# Patient Record
Sex: Female | Born: 1995 | Race: Black or African American | Hispanic: No | Marital: Single
Health system: Southern US, Community
[De-identification: ages and names within clinical notes are randomized; demographics above are authoritative.]

## PROBLEM LIST (undated history)

## (undated) DIAGNOSIS — O009 Unspecified ectopic pregnancy without intrauterine pregnancy: Secondary | ICD-10-CM

## (undated) HISTORY — PX: ECTOPIC PREGNANCY SURGERY: SHX613

---
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
## 2018-08-06 DIAGNOSIS — O009 Unspecified ectopic pregnancy without intrauterine pregnancy: Secondary | ICD-10-CM

## 2018-08-06 HISTORY — DX: Unspecified ectopic pregnancy without intrauterine pregnancy: O00.90

## 2018-10-23 ENCOUNTER — Encounter (HOSPITAL_BASED_OUTPATIENT_CLINIC_OR_DEPARTMENT_OTHER): Payer: Self-pay | Admitting: *Deleted

## 2018-10-23 ENCOUNTER — Other Ambulatory Visit: Payer: Self-pay

## 2018-10-23 ENCOUNTER — Emergency Department (HOSPITAL_BASED_OUTPATIENT_CLINIC_OR_DEPARTMENT_OTHER)
Admission: EM | Admit: 2018-10-23 | Discharge: 2018-10-23 | Disposition: A | Discharge status: Home / Self Care | Payer: Medicaid Other | Attending: Emergency Medicine | Admitting: Emergency Medicine

## 2018-10-23 DIAGNOSIS — O211 Hyperemesis gravidarum with metabolic disturbance: Secondary | ICD-10-CM | POA: Insufficient documentation

## 2018-10-23 DIAGNOSIS — Z3A01 Less than 8 weeks gestation of pregnancy: Secondary | ICD-10-CM | POA: Insufficient documentation

## 2018-10-23 DIAGNOSIS — E876 Hypokalemia: Secondary | ICD-10-CM | POA: Diagnosis not present

## 2018-10-23 DIAGNOSIS — R1110 Vomiting, unspecified: Secondary | ICD-10-CM

## 2018-10-23 LAB — CBC WITH DIFFERENTIAL/PLATELET
Abs Immature Granulocytes: 0.02 10*3/uL (ref 0.00–0.07)
Basophils Absolute: 0 10*3/uL (ref 0.0–0.1)
Basophils Relative: 0 %
Eosinophils Absolute: 0 10*3/uL (ref 0.0–0.5)
Eosinophils Relative: 0 %
HCT: 43.8 % (ref 36.0–46.0)
Hemoglobin: 13.8 g/dL (ref 12.0–15.0)
Immature Granulocytes: 0 %
Lymphocytes Relative: 32 %
Lymphs Abs: 2.5 10*3/uL (ref 0.7–4.0)
MCH: 26.4 pg (ref 26.0–34.0)
MCHC: 31.5 g/dL (ref 30.0–36.0)
MCV: 83.9 fL (ref 80.0–100.0)
MONO ABS: 0.5 10*3/uL (ref 0.1–1.0)
Monocytes Relative: 7 %
Neutro Abs: 4.7 10*3/uL (ref 1.7–7.7)
Neutrophils Relative %: 61 %
Platelets: 466 10*3/uL — ABNORMAL HIGH (ref 150–400)
RBC: 5.22 MIL/uL — AB (ref 3.87–5.11)
RDW: 12.7 % (ref 11.5–15.5)
WBC: 7.8 10*3/uL (ref 4.0–10.5)
nRBC: 0 % (ref 0.0–0.2)

## 2018-10-23 LAB — COMPREHENSIVE METABOLIC PANEL
ALT: 21 U/L (ref 0–44)
AST: 22 U/L (ref 15–41)
Albumin: 4.9 g/dL (ref 3.5–5.0)
Alkaline Phosphatase: 73 U/L (ref 38–126)
Anion gap: 11 (ref 5–15)
BUN: 16 mg/dL (ref 6–20)
CO2: 20 mmol/L — AB (ref 22–32)
Calcium: 9.5 mg/dL (ref 8.9–10.3)
Chloride: 102 mmol/L (ref 98–111)
Creatinine, Ser: 0.93 mg/dL (ref 0.44–1.00)
GFR calc Af Amer: >60 mL/min (ref >60)
GFR calc non Af Amer: >60 mL/min (ref >60)
Glucose, Bld: 98 mg/dL (ref 70–99)
Potassium: 2.9 mmol/L — ABNORMAL LOW (ref 3.5–5.1)
Sodium: 133 mmol/L — ABNORMAL LOW (ref 135–145)
Total Bilirubin: 0.9 mg/dL (ref 0.3–1.2)
Total Protein: 9 g/dL — ABNORMAL HIGH (ref 6.5–8.1)

## 2018-10-23 MED ORDER — POTASSIUM CHLORIDE CRYS ER 20 MEQ PO TBCR
40.0000 meq | EXTENDED_RELEASE_TABLET | Freq: Once | ORAL | Status: AC
  Administered 2018-10-23: 40 meq via ORAL
  Filled 2018-10-23: qty 2

## 2018-10-23 MED ORDER — SODIUM CHLORIDE 0.9 % IV BOLUS
1000.0000 mL | Freq: Once | INTRAVENOUS | Status: AC
  Administered 2018-10-23: 1000 mL via INTRAVENOUS

## 2018-10-23 MED ORDER — ONDANSETRON 4 MG PO TBDP: 4.0000 mg | ORAL_TABLET | Freq: Three times a day (TID) | ORAL | 0 refills | Status: DC | PRN

## 2018-10-23 MED ORDER — ONDANSETRON 8 MG PO TBDP
8.0000 mg | ORAL_TABLET | Freq: Once | ORAL | Status: AC
  Administered 2018-10-23: 8 mg via ORAL
  Filled 2018-10-23: qty 1

## 2018-10-23 NOTE — ED Notes (Signed)
NAD at this time. Pt is stable and going home.  

## 2018-10-23 NOTE — ED Triage Notes (Addendum)
Pt c/o vomiting  x 2 weeks , pt is preg, seen by [facility] ED yesterday and on 15 th

## 2018-10-23 NOTE — Discharge Instructions (Addendum)
Home to rest. Push hydrating fluids like G2 (Gatorade with less sugar).  Zofran as needed for vomiting. Limit use of zofran. Zofran can cause constipation. Follow up with your OB as scheduled.

## 2018-10-23 NOTE — ED Provider Notes (Signed)
[facility] EMERGENCY DEPARTMENT Provider Note   CSN: [id number] Arrival date & time: 10/23/18  1652     History   Chief Complaint Chief Complaint  Patient presents with  . Emesis    HPI Mary Grimes is a 22 y.o. female.  22 year old female presents with complaint of nausea and vomiting in early pregnancy.  Patient was seen at [facility] ER on December 15, had an ultrasound showing 6 weeks and 0 days gestational sac.  Patient continued to vomit and called her OBs office who sent and likely just yesterday.  Patient states that she is unable to keep the Diclegis down.  Patient has tried OTC medications without relief.  Patient states unable to keep down small sips of liquids or solids.  Denies abdominal pain, fevers, chills, changes in bowel or bladder habits.  No other complaints or concerns at this time.     History reviewed. No pertinent past medical history.  There are no active problems to display for this patient.   History reviewed. No pertinent surgical history.   OB History    Gravida  1   Para      Term      Preterm      AB      Living        SAB      TAB      Ectopic      Multiple      Live Births               Home Medications    Prior to Admission medications   Medication Sig Start Date End Date Taking? Authorizing Provider  Doxylamine-Pyridoxine (DICLEGIS PO) Take by mouth.   Yes [provider]  ondansetron (ZOFRAN ODT) 4 MG disintegrating tablet Take 1 tablet (4 mg total) by mouth every 8 (eight) hours as needed for nausea or vomiting. 10/23/18   Jeannie Fend, PA-C    Family History No family history on file.  Social History Social History   Tobacco Use  . Smoking status: Never Smoker  Substance Use Topics  . Alcohol use: Not Currently  . Drug use: Yes    Types: Marijuana     Allergies   Patient has no known allergies.   Review of Systems Review of Systems  Constitutional: Negative for  fever.  Gastrointestinal: Positive for nausea and vomiting. Negative for abdominal pain, constipation and diarrhea.  Genitourinary: Negative for dysuria, frequency and urgency.  Skin: Negative for rash.  Allergic/Immunologic: Negative for immunocompromised state.  Neurological: Negative for weakness.  All other systems reviewed and are negative.    Physical Exam Updated Vital Signs BP 115/72   Pulse 80   Temp 98.1 F (36.7 C) (Oral)   Resp 18   Ht 6\' 7"  (2.007 m)   Wt 77.1 kg   SpO2 100%   BMI 19.15 kg/m   Physical Exam Vitals signs and nursing note reviewed.  Constitutional:      General: She is not in acute distress.    Appearance: She is well-developed. She is not diaphoretic.  HENT:     Head: Normocephalic and atraumatic.     Mouth/Throat:     Mouth: Mucous membranes are moist.  Cardiovascular:     Rate and Rhythm: Normal rate and regular rhythm.     Pulses: Normal pulses.  Pulmonary:     Effort: Pulmonary effort is normal.     Breath sounds: Normal breath sounds.  Abdominal:  General: Abdomen is flat. There is no distension.     Tenderness: There is no abdominal tenderness.  Skin:    General: Skin is warm and dry.     Coloration: Skin is not pale.  Neurological:     Mental Status: She is alert and oriented to person, place, and time.  Psychiatric:        Mood and Affect: Mood normal.        Behavior: Behavior normal.      ED Treatments / Results  Labs (all labs ordered are listed, but only abnormal results are displayed) Labs Reviewed  CBC WITH DIFFERENTIAL/PLATELET - Abnormal; Notable for the following components:      Result Value   RBC 5.22 (*)    Platelets 466 (*)    All other components within normal limits  COMPREHENSIVE METABOLIC PANEL - Abnormal; Notable for the following components:   Sodium 133 (*)    Potassium 2.9 (*)    CO2 20 (*)    Total Protein 9.0 (*)    All other components within normal limits     EKG None  Radiology No results found.  Procedures Procedures (including critical care time)  Medications Ordered in ED Medications  sodium chloride 0.9 % bolus 1,000 mL (0 mLs Intravenous Stopped 10/23/18 1833)  ondansetron (ZOFRAN-ODT) disintegrating tablet 8 mg (8 mg Oral Given 10/23/18 1726)  potassium chloride SA (K-DUR,KLOR-CON) CR tablet 40 mEq (40 mEq Oral Given 10/23/18 1801)     Initial Impression / Assessment and Plan / ED Course  I have reviewed the triage vital signs and the nursing notes.  Pertinent labs & imaging results that were available during my care of the patient were reviewed by me and considered in my medical decision making (see chart for details).  Clinical Course as of Oct 24 1851  Wed Oct 23, 2018  [id number] 22 year old female presents with complaint of ongoing nausea and vomiting in pregnancy.  Patient has tried Diclegis as prescribed by her OB however continues to have vomiting.  Patient denies pain, had recent ultrasound 3 days ago showing 6 weeks 0 days gestational sac and is scheduled to see her OB in 2 days.  Review of lab work shows CBC without significant findings, CMP with hypokalemia hyponatremia, sodium of 133, potassium 2.9.  Patient was given 1 L normal saline IV as well as 40 mEq potassium p.o.  Patient also given Zofran ODT.  Patient is tolerating p.o. fluids and has kept her Zofran down.  Patient reports feeling much better.  Patient was given prescription for Zofran ODT, discussed limited use of Zofran and recommend she follow-up with her OB in 2 days as scheduled.   [LM]    Clinical Course User Index [LM] Jeannie FendMurphy,  A, PA-C   Final Clinical Impressions(s) / ED Diagnoses   Final diagnoses:  Hyperemesis  Hypokalemia    ED Discharge Orders         Ordered    ondansetron (ZOFRAN ODT) 4 MG disintegrating tablet  Every 8 hours PRN     10/23/18 1847           Jeannie FendMurphy,  A, PA-C 10/23/18 [id number]    Plunkett, Whitney,  MD 10/23/18 2012

## 2018-11-01 ENCOUNTER — Encounter (HOSPITAL_BASED_OUTPATIENT_CLINIC_OR_DEPARTMENT_OTHER): Payer: Self-pay | Admitting: *Deleted

## 2018-11-01 ENCOUNTER — Other Ambulatory Visit: Payer: Self-pay

## 2018-11-01 ENCOUNTER — Emergency Department (HOSPITAL_BASED_OUTPATIENT_CLINIC_OR_DEPARTMENT_OTHER)
Admission: EM | Admit: 2018-11-01 | Discharge: 2018-11-01 | Disposition: A | Discharge status: Home / Self Care | Payer: Medicaid Other | Attending: Emergency Medicine | Admitting: Emergency Medicine

## 2018-11-01 DIAGNOSIS — N760 Acute vaginitis: Secondary | ICD-10-CM

## 2018-11-01 DIAGNOSIS — Z3A01 Less than 8 weeks gestation of pregnancy: Secondary | ICD-10-CM | POA: Insufficient documentation

## 2018-11-01 DIAGNOSIS — B9689 Other specified bacterial agents as the cause of diseases classified elsewhere: Secondary | ICD-10-CM

## 2018-11-01 DIAGNOSIS — O23591 Infection of other part of genital tract in pregnancy, first trimester: Secondary | ICD-10-CM | POA: Diagnosis not present

## 2018-11-01 DIAGNOSIS — O210 Mild hyperemesis gravidarum: Secondary | ICD-10-CM

## 2018-11-01 LAB — WET PREP, GENITAL
SPERM: NONE SEEN
Trich, Wet Prep: NONE SEEN
Yeast Wet Prep HPF POC: NONE SEEN

## 2018-11-01 LAB — COMPREHENSIVE METABOLIC PANEL
ALT: 18 U/L (ref 0–44)
AST: 23 U/L (ref 15–41)
Albumin: 4.4 g/dL (ref 3.5–5.0)
Alkaline Phosphatase: 73 U/L (ref 38–126)
Anion gap: 12 (ref 5–15)
BUN: 11 mg/dL (ref 6–20)
CO2: 21 mmol/L — ABNORMAL LOW (ref 22–32)
Calcium: 9.5 mg/dL (ref 8.9–10.3)
Chloride: 99 mmol/L (ref 98–111)
Creatinine, Ser: 0.81 mg/dL (ref 0.44–1.00)
GFR calc Af Amer: >60 mL/min (ref >60)
Glucose, Bld: 92 mg/dL (ref 70–99)
Potassium: 3.1 mmol/L — ABNORMAL LOW (ref 3.5–5.1)
Sodium: 132 mmol/L — ABNORMAL LOW (ref 135–145)
Total Bilirubin: 1.1 mg/dL (ref 0.3–1.2)
Total Protein: 8.1 g/dL (ref 6.5–8.1)

## 2018-11-01 MED ORDER — METRONIDAZOLE 0.75 % VA GEL: 1.0000 | Freq: Two times a day (BID) | VAGINAL | 0 refills | Status: DC

## 2018-11-01 MED ORDER — LACTATED RINGERS IV BOLUS
2000.0000 mL | Freq: Once | INTRAVENOUS | Status: AC
  Administered 2018-11-01: 2000 mL via INTRAVENOUS

## 2018-11-01 MED ORDER — METOCLOPRAMIDE HCL 5 MG/ML IJ SOLN
10.0000 mg | Freq: Once | INTRAMUSCULAR | Status: AC
  Administered 2018-11-01: 10 mg via INTRAVENOUS
  Filled 2018-11-01: qty 2

## 2018-11-01 MED ORDER — PROMETHAZINE HCL 25 MG RE SUPP: 25.0000 mg | Freq: Four times a day (QID) | RECTAL | 0 refills | Status: DC | PRN

## 2018-11-01 MED FILL — PHENADOZ 25 MG SUPP: 25 | 3 days supply | Qty: 12 | Fill #0

## 2018-11-01 MED FILL — metroNIDAZOLE 0.75 % GEL: 0.75 | 7 days supply | Qty: 70 | Fill #0

## 2018-11-01 NOTE — Discharge Instructions (Signed)
Sure you are taking small amounts of crackers or something to keep some food on your stomach to hopefully help with the nausea.

## 2018-11-01 NOTE — ED Triage Notes (Signed)
Vomiting x 3 weeks. She is [redacted] weeks pregnant.

## 2018-11-01 NOTE — ED Notes (Signed)
ED Provider at bedside. 

## 2018-11-01 NOTE — ED Notes (Signed)
Pt understood dc material. NAD noted. [facility]

## 2018-11-01 NOTE — ED Provider Notes (Signed)
[facility] EMERGENCY DEPARTMENT Provider Note   CSN: [id number] Arrival date & time: 11/01/18  1244     History   Chief Complaint Chief Complaint  Patient presents with  . Emesis During Pregnancy    HPI Mary Grimes is a 22 y.o. female.  The history is provided by the patient.  Emesis   This is a recurrent problem. Episode onset: 3 weeks. The problem occurs 5 to 10 times per day. The problem has not changed since onset.The emesis has an appearance of stomach contents and bilious material. There has been no fever. Pertinent negatives include no abdominal pain, no chills, no cough, no diarrhea, no fever and no URI. Associated symptoms comments: No vaginal bleeding but in the last week increased malodorous vaginal discharge.  Same sexual partner for over a year.  No STI in the last year.  US done by OB and [redacted] weeks gestation with normal IUP.  Seen multiple times in the last 3 weeks for persistent vomiting.  Taking zofran and diclygis without improvement..    History reviewed. No pertinent past medical history.  There are no active problems to display for this patient.   History reviewed. No pertinent surgical history.   OB History    Gravida  1   Para      Term      Preterm      AB      Living        SAB      TAB      Ectopic      Multiple      Live Births               Home Medications    Prior to Admission medications   Medication Sig Start Date End Date Taking? Authorizing Provider  Doxylamine-Pyridoxine (DICLEGIS PO) Take by mouth.   Yes [provider]  ondansetron (ZOFRAN ODT) 4 MG disintegrating tablet Take 1 tablet (4 mg total) by mouth every 8 (eight) hours as needed for nausea or vomiting. 10/23/18  Yes Jeannie FendMurphy, Laura A, PA-C    Family History No family history on file.  Social History Social History   Tobacco Use  . Smoking status: Never Smoker  . Smokeless tobacco: Never Used  Substance Use Topics  . Alcohol  use: Not Currently  . Drug use: Yes    Types: Marijuana     Allergies   Patient has no known allergies.   Review of Systems Review of Systems  Constitutional: Negative for chills and fever.  Respiratory: Negative for cough.   Gastrointestinal: Positive for vomiting. Negative for abdominal pain and diarrhea.  All other systems reviewed and are negative.    Physical Exam Updated Vital Signs BP 113/82   Pulse (!) 104   Temp 98.3 F (36.8 C) (Oral)   Resp 18   Ht 5\' 6"  (1.676 m)   Wt 74.1 kg   SpO2 100%   BMI 26.36 kg/m   Physical Exam Vitals signs and nursing note reviewed.  Constitutional:      General: She is not in acute distress.    Appearance: She is well-developed.  HENT:     Head: Normocephalic and atraumatic.     Mouth/Throat:     Mouth: Mucous membranes are dry.  Eyes:     Pupils: Pupils are equal, round, and reactive to light.  Cardiovascular:     Rate and Rhythm: Regular rhythm. Tachycardia present.     Heart sounds: Normal  heart sounds. No murmur. No friction rub.  Pulmonary:     Effort: Pulmonary effort is normal.     Breath sounds: Normal breath sounds. No wheezing or rales.  Abdominal:     General: Bowel sounds are normal. There is no distension.     Palpations: Abdomen is soft.     Tenderness: There is no abdominal tenderness. There is no guarding or rebound.  Genitourinary:    Labia:        Right: No rash or tenderness.        Left: No rash or tenderness.      Vagina: Vaginal discharge present. No erythema or tenderness.     Cervix: Discharge present. No cervical motion tenderness or cervical bleeding.     Uterus: Enlarged. Not tender.      Adnexa: Right adnexa normal and left adnexa normal.  Musculoskeletal: Normal range of motion.        General: No tenderness.     Comments: No edema  Skin:    General: Skin is warm and dry.     Findings: No rash.  Neurological:     Mental Status: She is alert and oriented to person, place, and time.       Cranial Nerves: No cranial nerve deficit.  Psychiatric:        Behavior: Behavior normal.      ED Treatments / Results  Labs (all labs ordered are listed, but only abnormal results are displayed) Labs Reviewed  WET PREP, GENITAL - Abnormal; Notable for the following components:      Result Value   Clue Cells Wet Prep HPF POC PRESENT (*)    WBC, Wet Prep HPF POC MANY (*)    All other components within normal limits  COMPREHENSIVE METABOLIC PANEL - Abnormal; Notable for the following components:   Sodium 132 (*)    Potassium 3.1 (*)    CO2 21 (*)    All other components within normal limits  GC/CHLAMYDIA PROBE AMP ([place]) NOT AT [facility]    EKG None  Radiology No results found.  Procedures Procedures (including critical care time)  Medications Ordered in ED Medications  lactated ringers bolus 2,000 mL (2,000 mLs Intravenous New Bag/Given 11/01/18 1328)  metoCLOPramide (REGLAN) injection 10 mg (10 mg Intravenous Given 11/01/18 1330)     Initial Impression / Assessment and Plan / ED Course  I have reviewed the triage vital signs and the nursing notes.  Pertinent labs & imaging results that were available during my care of the patient were reviewed by me and considered in my medical decision making (see chart for details).    Pt is a [id number] currently approximately [redacted] weeks pregnant with an intrauterine pregnancy presenting with persistent vomiting.  Patient has been diagnosed with hyperemesis during this pregnancy and states she is taking Ticlid just and Zofran at home but continues to vomit between 6-10 episodes per day.  She has not been able to do anything because of being so nauseated.  2 other term pregnancies without similar symptoms.  She has diffuse abdominal discomfort but no localized pain.  In the last week she has complained of some malodorous discharge.  She denies any vaginal pain or bleeding.  She has no lower abdominal symptoms or urinary symptoms.  She  states she can even swallow her own saliva because it just makes her nauseated.  Patient appears dehydrated on exam with no focal abdominal findings.  Patient given 2 L of LR will ensure  no hypokalemia.  We will do a pelvic exam to ensure no signs of infection but low suspicion for STI as patient is only sexually active with the same partner for the last year.  Patient also given Reglan for the nausea.  3:54 PM Patient's labs with improved potassium today 3.1.  She received 2 L of LR and Reglan with significant improvement in her symptoms.  Pelvic exam with discharge and wet prep showing bacterial vaginosis.  Given patient's recurrent vomiting we will do MetroGel as oral Flagyl may cause worsening vomiting.  Patient will also be given Phenergan suppositories that she states is just hard for her to keep anything down.  Recommended taking little bits of food at a time to try to keep her stomach from being empty.  Also encouraged her to continue to try to reach out to her OB/GYN for follow-up.  Final Clinical Impressions(s) / ED Diagnoses   Final diagnoses:  Hyperemesis gravidarum  Bacterial vaginosis    ED Discharge Orders         Ordered    promethazine (PHENERGAN) 25 MG suppository  Every 6 hours PRN     11/01/18 1552    metroNIDAZOLE (METROGEL VAGINAL) 0.75 % vaginal gel  2 times daily     11/01/18 1552           Gwyneth SproutPlunkett, Rayhan Groleau, MD 11/01/18 1555

## 2018-11-04 LAB — GC/CHLAMYDIA PROBE AMP (~~LOC~~) NOT AT ARMC
Chlamydia: NEGATIVE
Neisseria Gonorrhea: NEGATIVE

## 2018-11-24 ENCOUNTER — Other Ambulatory Visit: Payer: Self-pay

## 2018-11-24 ENCOUNTER — Encounter (HOSPITAL_BASED_OUTPATIENT_CLINIC_OR_DEPARTMENT_OTHER): Payer: Self-pay | Admitting: Emergency Medicine

## 2018-11-24 ENCOUNTER — Emergency Department (HOSPITAL_BASED_OUTPATIENT_CLINIC_OR_DEPARTMENT_OTHER)
Admission: EM | Admit: 2018-11-24 | Discharge: 2018-11-24 | Disposition: A | Discharge status: Home / Self Care | Payer: Medicaid Other | Attending: Emergency Medicine | Admitting: Emergency Medicine

## 2018-11-24 DIAGNOSIS — Z79899 Other long term (current) drug therapy: Secondary | ICD-10-CM | POA: Diagnosis not present

## 2018-11-24 DIAGNOSIS — R102 Pelvic and perineal pain: Secondary | ICD-10-CM | POA: Insufficient documentation

## 2018-11-24 DIAGNOSIS — Z3A10 10 weeks gestation of pregnancy: Secondary | ICD-10-CM | POA: Diagnosis not present

## 2018-11-24 DIAGNOSIS — O9989 Other specified diseases and conditions complicating pregnancy, childbirth and the puerperium: Secondary | ICD-10-CM | POA: Diagnosis not present

## 2018-11-24 DIAGNOSIS — O210 Mild hyperemesis gravidarum: Secondary | ICD-10-CM | POA: Diagnosis not present

## 2018-11-24 DIAGNOSIS — R630 Anorexia: Secondary | ICD-10-CM | POA: Diagnosis not present

## 2018-11-24 HISTORY — DX: Unspecified ectopic pregnancy without intrauterine pregnancy: O00.90

## 2018-11-24 LAB — BASIC METABOLIC PANEL
Anion gap: 8 (ref 5–15)
BUN: 8 mg/dL (ref 6–20)
CALCIUM: 9.4 mg/dL (ref 8.9–10.3)
CO2: 22 mmol/L (ref 22–32)
Chloride: 102 mmol/L (ref 98–111)
Creatinine, Ser: 0.64 mg/dL (ref 0.44–1.00)
Glucose, Bld: 81 mg/dL (ref 70–99)
Potassium: 3.1 mmol/L — ABNORMAL LOW (ref 3.5–5.1)
Sodium: 132 mmol/L — ABNORMAL LOW (ref 135–145)

## 2018-11-24 LAB — CBC WITH DIFFERENTIAL/PLATELET
Abs Immature Granulocytes: 0.02 10*3/uL (ref 0.00–0.07)
BASOS PCT: 0 %
Basophils Absolute: 0 10*3/uL (ref 0.0–0.1)
EOS ABS: 0.1 10*3/uL (ref 0.0–0.5)
Eosinophils Relative: 1 %
HCT: 33.8 % — ABNORMAL LOW (ref 36.0–46.0)
Hemoglobin: 10.6 g/dL — ABNORMAL LOW (ref 12.0–15.0)
Immature Granulocytes: 0 %
Lymphocytes Relative: 29 %
Lymphs Abs: 2.3 10*3/uL (ref 0.7–4.0)
MCH: 26.7 pg (ref 26.0–34.0)
MCHC: 31.4 g/dL (ref 30.0–36.0)
MCV: 85.1 fL (ref 80.0–100.0)
Monocytes Absolute: 0.4 10*3/uL (ref 0.1–1.0)
Monocytes Relative: 5 %
Neutro Abs: 5.2 10*3/uL (ref 1.7–7.7)
Neutrophils Relative %: 65 %
PLATELETS: 377 10*3/uL (ref 150–400)
RBC: 3.97 MIL/uL (ref 3.87–5.11)
RDW: 14 % (ref 11.5–15.5)
WBC: 8.1 10*3/uL (ref 4.0–10.5)
nRBC: 0 % (ref 0.0–0.2)

## 2018-11-24 LAB — URINALYSIS, ROUTINE W REFLEX MICROSCOPIC
Glucose, UA: 100 mg/dL — AB
Hgb urine dipstick: NEGATIVE
Ketones, ur: >80 mg/dL — AB
Leukocytes, UA: NEGATIVE
Nitrite: NEGATIVE
Protein, ur: 30 mg/dL — AB
Specific Gravity, Urine: >1.03 — ABNORMAL HIGH (ref 1.005–1.030)
pH: 6 (ref 5.0–8.0)

## 2018-11-24 LAB — URINALYSIS, MICROSCOPIC (REFLEX): RBC / HPF: NONE SEEN RBC/hpf (ref 0–5)

## 2018-11-24 MED ORDER — SODIUM CHLORIDE 0.9 % IV BOLUS
1000.0000 mL | Freq: Once | INTRAVENOUS | Status: AC
  Administered 2018-11-24: 1000 mL via INTRAVENOUS

## 2018-11-24 MED ORDER — METOCLOPRAMIDE HCL 5 MG/ML IJ SOLN
10.0000 mg | Freq: Once | INTRAMUSCULAR | Status: AC
  Administered 2018-11-24: 10 mg via INTRAVENOUS
  Filled 2018-11-24: qty 2

## 2018-11-24 MED ORDER — POTASSIUM CHLORIDE CRYS ER 20 MEQ PO TBCR
40.0000 meq | EXTENDED_RELEASE_TABLET | Freq: Once | ORAL | Status: AC
  Administered 2018-11-24: 40 meq via ORAL
  Filled 2018-11-24: qty 2

## 2018-11-24 NOTE — ED Notes (Signed)
Pt ambulated to restroom without assistance. NAD  

## 2018-11-24 NOTE — ED Notes (Addendum)
Pt c/o lower abdominal cramping that she rates as 0/10 for pain, with nausea and vomiting x 3 days, with poor appetite. She has been recently hospitalized and  treated for same symptoms but symptoms returned. Pt denies fever

## 2018-11-24 NOTE — ED Provider Notes (Signed)
[facility] EMERGENCY DEPARTMENT Provider Note   CSN: [id number] Arrival date & time: 11/24/18  1816     History   Chief Complaint Chief Complaint  Patient presents with  . Emesis    HPI Mary Grimes is a 23 y.o. female.  Patient is a 23 year old female who presents with nausea and vomiting.  She is [redacted] weeks pregnant.  She has had problems with hyperemesis gravidarum with this urgency.  She has been evaluated multiple times.  She was seen at [facility] on January 6 and treated with IV fluids and antiemetics.  She states she has been doing well but over the last 3 days she has had recurrence of the nausea and vomiting has not been able keep anything down.  She has some mild lower abdominal cramping.  No vaginal bleeding or discharge.     Past Medical History:  Diagnosis Date  . Ectopic pregnancy 08/2018    There are no active problems to display for this patient.   Past Surgical History:  Procedure Laterality Date  . ECTOPIC PREGNANCY SURGERY       OB History    Gravida  1   Para      Term      Preterm      AB      Living        SAB      TAB      Ectopic      Multiple      Live Births               Home Medications    Prior to Admission medications   Medication Sig Start Date End Date Taking? Authorizing Provider  Doxylamine-Pyridoxine (DICLEGIS PO) Take by mouth.    [provider]  metroNIDAZOLE (METROGEL VAGINAL) 0.75 % vaginal gel Place 1 Applicatorful vaginally 2 (two) times daily. 11/01/18   Gwyneth SproutPlunkett, Whitney, MD  ondansetron (ZOFRAN ODT) 4 MG disintegrating tablet Take 1 tablet (4 mg total) by mouth every 8 (eight) hours as needed for nausea or vomiting. 10/23/18   Jeannie FendMurphy, Laura A, PA-C  promethazine (PHENERGAN) 25 MG suppository Place 1 suppository (25 mg total) rectally every 6 (six) hours as needed for nausea or vomiting. 11/01/18   Gwyneth SproutPlunkett, Whitney, MD    Family History No family history on  file.  Social History Social History   Tobacco Use  . Smoking status: Never Smoker  . Smokeless tobacco: Never Used  Substance Use Topics  . Alcohol use: Not Currently  . Drug use: Not Currently    Types: Marijuana     Allergies   Patient has no known allergies.   Review of Systems Review of Systems  Constitutional: Negative for chills, diaphoresis, fatigue and fever.  HENT: Negative for congestion, rhinorrhea and sneezing.   Eyes: Negative.   Respiratory: Negative for cough, chest tightness and shortness of breath.   Cardiovascular: Negative for chest pain and leg swelling.  Gastrointestinal: Positive for nausea and vomiting. Negative for abdominal pain, blood in stool and diarrhea.  Genitourinary: Negative for difficulty urinating, flank pain, frequency and hematuria.  Musculoskeletal: Negative for arthralgias and back pain.  Skin: Negative for rash.  Neurological: Negative for dizziness, speech difficulty, weakness, numbness and headaches.     Physical Exam Updated Vital Signs BP 119/80 (BP Location: Left Arm)   Pulse 84   Temp 98.3 F (36.8 C) (Oral)   Resp 16   Ht 5\' 6"  (1.676 m)   Wt  72.6 kg   SpO2 100%   BMI 25.82 kg/m   Physical Exam Constitutional:      Appearance: She is well-developed.  HENT:     Head: Normocephalic and atraumatic.  Eyes:     Pupils: Pupils are equal, round, and reactive to light.  Neck:     Musculoskeletal: Normal range of motion and neck supple.  Cardiovascular:     Rate and Rhythm: Regular rhythm. Tachycardia present.     Heart sounds: Normal heart sounds.  Pulmonary:     Effort: Pulmonary effort is normal. No respiratory distress.     Breath sounds: Normal breath sounds. No wheezing or rales.  Chest:     Chest wall: No tenderness.  Abdominal:     General: Bowel sounds are normal.     Palpations: Abdomen is soft.     Tenderness: There is no abdominal tenderness. There is no guarding or rebound.  Musculoskeletal: Normal  range of motion.  Lymphadenopathy:     Cervical: No cervical adenopathy.  Skin:    General: Skin is warm and dry.     Findings: No rash.  Neurological:     Mental Status: She is alert and oriented to person, place, and time.      ED Treatments / Results  Labs (all labs ordered are listed, but only abnormal results are displayed) Labs Reviewed  URINALYSIS, ROUTINE W REFLEX MICROSCOPIC - Abnormal; Notable for the following components:      Result Value   Color, Urine ORANGE (*)    Specific Gravity, Urine >1.030 (*)    Glucose, UA 100 (*)    Bilirubin Urine MODERATE (*)    Ketones, ur >80 (*)    Protein, ur 30 (*)    All other components within normal limits  BASIC METABOLIC PANEL - Abnormal; Notable for the following components:   Sodium 132 (*)    Potassium 3.1 (*)    All other components within normal limits  CBC WITH DIFFERENTIAL/PLATELET - Abnormal; Notable for the following components:   Hemoglobin 10.6 (*)    HCT 33.8 (*)    All other components within normal limits  URINALYSIS, MICROSCOPIC (REFLEX) - Abnormal; Notable for the following components:   Bacteria, UA RARE (*)    All other components within normal limits    EKG None  Radiology No results found.  Procedures Procedures (including critical care time)  Medications Ordered in ED Medications  sodium chloride 0.9 % bolus 1,000 mL (0 mLs Intravenous Stopped 11/24/18 2129)  metoCLOPramide (REGLAN) injection 10 mg (10 mg Intravenous Given 11/24/18 2026)  potassium chloride SA (K-DUR,KLOR-CON) CR tablet 40 mEq (40 mEq Oral Given 11/24/18 2118)     Initial Impression / Assessment and Plan / ED Course  I have reviewed the triage vital signs and the nursing notes.  Pertinent labs & imaging results that were available during my care of the patient were reviewed by me and considered in my medical decision making (see chart for details).    Patient is a 24 year old female who presents with vomiting and  pregnancy.  She has no associate abdominal pain on exam.  Her labs show a hypokalemia which she has had in the past.  She was given a potassium replacement.  She was given IV fluids and Reglan.  She is feeling much better and is able to tolerate oral fluids.  She has medications to use at home.  She was discharged home in good condition.  She was encouraged to have  follow-up with her OB/GYN.  Return precautions were given.  Her heart rate has normalized.  Final Clinical Impressions(s) / ED Diagnoses   Final diagnoses:  Hyperemesis gravidarum    ED Discharge Orders    None       Rolan Bucco, MD 11/24/18 2210

## 2018-11-24 NOTE — ED Notes (Signed)
Pt given water for PO challenge 

## 2018-11-24 NOTE — ED Triage Notes (Signed)
Pt c/o vomiting. Pt is [redacted] weeks pregnant and per partner patient has been in and out of the hospital since being pregnant.

## 2021-07-02 ENCOUNTER — Emergency Department (HOSPITAL_COMMUNITY): Payer: No Typology Code available for payment source

## 2021-07-02 ENCOUNTER — Emergency Department (HOSPITAL_COMMUNITY)
Admission: EM | Admit: 2021-07-02 | Discharge: 2021-07-02 | Disposition: A | Discharge status: Home / Self Care | Payer: No Typology Code available for payment source | Attending: Emergency Medicine | Admitting: Emergency Medicine

## 2021-07-02 ENCOUNTER — Encounter (HOSPITAL_COMMUNITY): Payer: Self-pay

## 2021-07-02 ENCOUNTER — Other Ambulatory Visit: Payer: Self-pay

## 2021-07-02 DIAGNOSIS — R0789 Other chest pain: Secondary | ICD-10-CM | POA: Diagnosis not present

## 2021-07-02 DIAGNOSIS — Y92410 Unspecified street and highway as the place of occurrence of the external cause: Secondary | ICD-10-CM | POA: Insufficient documentation

## 2021-07-02 DIAGNOSIS — S4992XA Unspecified injury of left shoulder and upper arm, initial encounter: Secondary | ICD-10-CM | POA: Diagnosis present

## 2021-07-02 DIAGNOSIS — S5012XA Contusion of left forearm, initial encounter: Secondary | ICD-10-CM | POA: Insufficient documentation

## 2021-07-02 MED ORDER — IBUPROFEN 400 MG PO TABS
600.0000 mg | ORAL_TABLET | Freq: Once | ORAL | Status: AC
  Administered 2021-07-02: 600 mg via ORAL
  Filled 2021-07-02: qty 1
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
## 2021-07-02 NOTE — ED Notes (Signed)
Patient transported to X-ray 
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
## 2021-07-02 NOTE — Discharge Instructions (Signed)
Use Tylenol every 4 hours and ibuprofen every 6 as needed for pain in addition to ice regularly. Return for breathing difficulty or new concerns.

## 2021-07-02 NOTE — ED Notes (Signed)
PT NAMED WAS CALLED MULTIPLE TIMES AND DID NOT ANSWER

## 2021-10-05 ENCOUNTER — Encounter (HOSPITAL_BASED_OUTPATIENT_CLINIC_OR_DEPARTMENT_OTHER): Payer: Self-pay

## 2021-10-05 ENCOUNTER — Other Ambulatory Visit: Payer: Self-pay

## 2021-10-05 ENCOUNTER — Emergency Department (HOSPITAL_BASED_OUTPATIENT_CLINIC_OR_DEPARTMENT_OTHER)
Admission: EM | Admit: 2021-10-05 | Discharge: 2021-10-05 | Disposition: A | Discharge status: Home / Self Care | Payer: Medicaid Other | Attending: Emergency Medicine | Admitting: Emergency Medicine

## 2021-10-05 DIAGNOSIS — M791 Myalgia, unspecified site: Secondary | ICD-10-CM | POA: Diagnosis present

## 2021-10-05 DIAGNOSIS — Z28310 Unvaccinated for covid-19: Secondary | ICD-10-CM | POA: Insufficient documentation

## 2021-10-05 DIAGNOSIS — U071 COVID-19: Secondary | ICD-10-CM | POA: Diagnosis not present

## 2021-10-05 LAB — RESP PANEL BY RT-PCR (FLU A&B, COVID) ARPGX2
Influenza A by PCR: NEGATIVE
Influenza B by PCR: NEGATIVE
SARS Coronavirus 2 by RT PCR: POSITIVE — AB

## 2021-10-05 MED ORDER — ACETAMINOPHEN 160 MG/5ML PO SOLN
650.0000 mg | Freq: Once | ORAL | Status: AC
  Administered 2021-10-05: 650 mg via ORAL
  Filled 2021-10-05: qty 20.3

## 2021-10-05 MED ORDER — FLUTICASONE PROPIONATE 50 MCG/ACT NA SUSP: 1.0000 | Freq: Every day | NASAL | 2 refills | Status: DC

## 2021-10-05 MED ORDER — NIRMATRELVIR/RITONAVIR (PAXLOVID)TABLET: 3.0000 | ORAL_TABLET | Freq: Two times a day (BID) | ORAL | 0 refills | Status: AC

## 2021-10-05 MED ORDER — GUAIFENESIN-CODEINE 100-10 MG/5ML PO SOLN: 5.0000 mL | Freq: Three times a day (TID) | ORAL | 0 refills | Status: DC | PRN

## 2021-10-05 NOTE — ED Provider Notes (Signed)
[facility] EMERGENCY DEPT Provider Note   CSN: [id number] Arrival date & time: 10/05/21  1404     History Chief Complaint  Patient presents with   Generalized Body Aches    Mary Grimes is a 25 y.o. female.  This is a 25 y.o. female with significant medical history as below, including ectopic preg who presents to the ED with complaint of uri s/s. Nonproductive cough, arthralgias, myalgias, congestion, 1-2 episodes of loose stools yesterday.  Intermittent subjective fevers and chills.  She has not received COVID-19 or flu shot this year.  No changes urination.  No abnormal vaginal bleeding or discharge.  No abdominal pain.  No nausea or vomiting.  She does have poor appetite.  Patient intake over the past 4 to 5 days for the duration of her symptoms.  Onset approximate 4 to 5 days ago.  No medications at home prior to arrival, no recent dietary changes or recent travel.  No chest pain or dyspnea.  No rashes.    The history is provided by the patient. No language interpreter was used.      Past Medical History:  Diagnosis Date   Ectopic pregnancy 08/2018    There are no problems to display for this patient.   Past Surgical History:  Procedure Laterality Date   ECTOPIC PREGNANCY SURGERY       OB History     Gravida  1   Para      Term      Preterm      AB      Living         SAB      IAB      Ectopic      Multiple      Live Births              History reviewed. No pertinent family history.  Social History   Tobacco Use   Smoking status: Never   Smokeless tobacco: Never  Vaping Use   Vaping Use: Never used  Substance Use Topics   Alcohol use: Not Currently   Drug use: Not Currently    Types: Marijuana    Home Medications Prior to Admission medications   Medication Sig Start Date End Date Taking? Authorizing Provider  Doxylamine-Pyridoxine (DICLEGIS PO) Take by mouth.    [provider]  metroNIDAZOLE  (METROGEL VAGINAL) 0.75 % vaginal gel Place 1 Applicatorful vaginally 2 (two) times daily. 11/01/18   Gwyneth Sprout, MD  ondansetron (ZOFRAN ODT) 4 MG disintegrating tablet Take 1 tablet (4 mg total) by mouth every 8 (eight) hours as needed for nausea or vomiting. 10/23/18   Jeannie Fend, PA-C  promethazine (PHENERGAN) 25 MG suppository Place 1 suppository (25 mg total) rectally every 6 (six) hours as needed for nausea or vomiting. 11/01/18   Gwyneth Sprout, MD    Allergies    Patient has no known allergies.  Review of Systems   Review of Systems  Constitutional:  Positive for chills and fever.  HENT:  Positive for congestion. Negative for facial swelling and trouble swallowing.   Eyes:  Negative for photophobia and visual disturbance.  Respiratory:  Positive for cough. Negative for shortness of breath.   Cardiovascular:  Negative for chest pain, palpitations and leg swelling.  Gastrointestinal:  Positive for diarrhea. Negative for abdominal pain, nausea and vomiting.  Endocrine: Negative for polydipsia and polyuria.  Genitourinary:  Negative for difficulty urinating and hematuria.  Musculoskeletal:  Negative for gait problem and  joint swelling.  Skin:  Negative for pallor and rash.  Neurological:  Negative for syncope and headaches.  Psychiatric/Behavioral:  Negative for agitation and confusion.    Physical Exam Updated Vital Signs BP 121/87 (BP Location: Left Arm)   Pulse 100   Temp (!) 100.4 F (38 C) (Oral)   Resp 16   Ht 5\' 6"  (1.676 m)   Wt 86.2 kg   SpO2 99%   BMI 30.67 kg/m   Physical Exam Vitals and nursing note reviewed.  Constitutional:      General: She is not in acute distress.    Appearance: Normal appearance.  HENT:     Head: Normocephalic and atraumatic.     Right Ear: External ear normal.     Left Ear: External ear normal.     Nose: Nose normal.     Mouth/Throat:     Mouth: Mucous membranes are moist.  Eyes:     General: No scleral icterus.        Right eye: No discharge.        Left eye: No discharge.  Cardiovascular:     Rate and Rhythm: Normal rate and regular rhythm.     Pulses: Normal pulses.     Heart sounds: Normal heart sounds.  Pulmonary:     Effort: Pulmonary effort is normal. No respiratory distress.     Breath sounds: Normal breath sounds.  Abdominal:     General: Abdomen is flat.     Tenderness: There is no abdominal tenderness.  Musculoskeletal:        General: Normal range of motion.     Cervical back: Normal range of motion.     Right lower leg: No edema.     Left lower leg: No edema.  Skin:    General: Skin is warm and dry.     Capillary Refill: Capillary refill takes less than 2 seconds.  Neurological:     Mental Status: She is alert.  Psychiatric:        Mood and Affect: Mood normal.        Behavior: Behavior normal.    ED Results / Procedures / Treatments   Labs (all labs ordered are listed, but only abnormal results are displayed) Labs Reviewed  RESP PANEL BY RT-PCR (FLU A&B, COVID) ARPGX2 - Abnormal; Notable for the following components:      Result Value   SARS Coronavirus 2 by RT PCR POSITIVE (*)    All other components within normal limits    EKG None  Radiology No results found.  Procedures Procedures   Medications Ordered in ED Medications  acetaminophen (TYLENOL) 160 MG/5ML solution 650 mg (650 mg Oral Given 10/05/21 1444)    ED Course  I have reviewed the triage vital signs and the nursing notes.  Pertinent labs & imaging results that were available during my care of the patient were reviewed by me and considered in my medical decision making (see chart for details).    MDM Rules/Calculators/A&P                           CC: uri s/s  This patient complains of above; this involves an extensive number of treatment options and is a complaint that carries with it a high risk of complications and morbidity. Vital signs were reviewed. Serious etiologies  considered.  Record review:   Previous records obtained and reviewed    Work up as above, notable for:  Lab results that were available during my care of the patient were reviewed by me and considered in my medical decision making.    Management: Patient given Tylenol  Reassessment:  Patient found a positive for COVID-19.  She has no respiratory distress, she is able to tolerate oral intake without difficulty.  Discussed supportive care at home including antitussives, antipyretics.  Rehydration orally.  Return precaution discussed.  Discussed Paxlovid and patient is interested in starting this.  Advised her to isolate at home, work note provided.  The patient improved significantly and was discharged in stable condition. Detailed discussions were had with the patient regarding current findings, and need for close f/u with PCP or on call doctor. The patient has been instructed to return immediately if the symptoms worsen in any way for re-evaluation. Patient verbalized understanding and is in agreement with current care plan. All questions answered prior to discharge.           This chart was dictated using voice recognition software.  Despite best efforts to proofread,  errors can occur which can change the documentation meaning.  Final Clinical Impression(s) / ED Diagnoses Final diagnoses:  [id number]    Rx / DC Orders ED Discharge Orders     None        Jeanell Sparrow, DO 10/05/21 1618

## 2021-10-05 NOTE — ED Triage Notes (Signed)
Patient BIB [facility] from Home.  Patient states she has been having Sore Throat, Diarrhea, Productive Cough, Congestion, Fevers for approximately 4 days.  NAD Noted during Triage. A&Ox4. GCS 15. BIB Stretcher/Wheelchair.

## 2022-01-11 ENCOUNTER — Other Ambulatory Visit (HOSPITAL_COMMUNITY)
Admission: RE | Admit: 2022-01-11 | Discharge: 2022-01-11 | Disposition: A | Discharge status: Home / Self Care | Payer: Medicaid Other | Source: Ambulatory Visit | Attending: Primary Care | Admitting: Primary Care

## 2022-01-11 ENCOUNTER — Encounter (INDEPENDENT_AMBULATORY_CARE_PROVIDER_SITE_OTHER): Payer: Self-pay | Admitting: Primary Care

## 2022-01-11 ENCOUNTER — Other Ambulatory Visit: Payer: Self-pay

## 2022-01-11 ENCOUNTER — Ambulatory Visit (INDEPENDENT_AMBULATORY_CARE_PROVIDER_SITE_OTHER): Payer: Medicaid Other | Admitting: Primary Care

## 2022-01-11 VITALS — BP 127/77 | HR 78 | Temp 97.7°F | Ht 66.0 in | Wt 199.0 lb

## 2022-01-11 DIAGNOSIS — D509 Iron deficiency anemia, unspecified: Secondary | ICD-10-CM | POA: Diagnosis not present

## 2022-01-11 DIAGNOSIS — N898 Other specified noninflammatory disorders of vagina: Secondary | ICD-10-CM | POA: Diagnosis not present

## 2022-01-11 DIAGNOSIS — Z7689 Persons encountering health services in other specified circumstances: Secondary | ICD-10-CM

## 2022-01-11 DIAGNOSIS — E876 Hypokalemia: Secondary | ICD-10-CM

## 2022-01-11 DIAGNOSIS — Z719 Counseling, unspecified: Secondary | ICD-10-CM | POA: Diagnosis not present

## 2022-01-11 NOTE — Progress Notes (Signed)
? ?New Patient Office Visit ? ?Subjective:  ?Patient ID: Mary Grimes, female    DOB: 09-Apr-1996  Age: 26 y.o. MRN: [id number] ? ?CC:  ?Chief Complaint  ?Patient presents with  ? New Patient (Initial Visit)  ?  Vaginal discharge  ? ? ?HPI ?Mary Grimes 26 year old female , presents for establishment of care and voices concerns about vaginal irritation.  She does admit to changing soaps , denies changing detergents, lotions, any different foods or fabric softener.  She is sexually active and at this time not on birth control. She has 3 children and uses protection. ? ?Past Medical History:  ?Diagnosis Date  ? Ectopic pregnancy 08/2018  ? ? ?Past Surgical History:  ?Procedure Laterality Date  ? ECTOPIC PREGNANCY SURGERY    ? ? ?History reviewed. No pertinent family history. ? ?Social History  ? ?Socioeconomic History  ? Marital status: Single  ?  Spouse name: Not on file  ? Number of children: Not on file  ? Years of education: Not on file  ? Highest education level: Not on file  ?Occupational History  ? Not on file  ?Tobacco Use  ? Smoking status: Never  ? Smokeless tobacco: Never  ?Vaping Use  ? Vaping Use: Never used  ?Substance and Sexual Activity  ? Alcohol use: Not Currently  ? Drug use: Not Currently  ?  Types: Marijuana  ? Sexual activity: Not on file  ?Other Topics Concern  ? Not on file  ?Social History Narrative  ? Not on file  ? ?Social Determinants of Health  ? ?Financial Resource Strain: Not on file  ?Food Insecurity: Not on file  ?Transportation Needs: Not on file  ?Physical Activity: Not on file  ?Stress: Not on file  ?Social Connections: Not on file  ?Intimate Partner Violence: Not on file  ? ? ?ROS ?Comprehensive ROS Pertinent positive and negative noted in HPI   ? ?Objective:  ? ?Today's Vitals: BP 127/77 (BP Location: Right Arm, Patient Position: Sitting, Cuff Size: Normal)   Pulse 78   Temp 97.7 ?F (36.5 ?C) (Oral)   Ht _0  (1.676 m)   Wt 199 lb (90.3 kg)   LMP 12/22/2021 (Exact  Date)   SpO2 97%   Breastfeeding No   BMI 32.12 kg/m?  ? ?Physical Exam ?Constitutional:   ?   Appearance: Normal appearance. She is obese.  ?HENT:  ?   Right Ear: Tympanic membrane normal.  ?   Left Ear: Tympanic membrane normal.  ?   Nose: Nose normal.  ?Eyes:  ?   Extraocular Movements: Extraocular movements intact.  ?   Pupils: Pupils are equal, round, and reactive to light.  ?Cardiovascular:  ?   Rate and Rhythm: Normal rate and regular rhythm.  ?Pulmonary:  ?   Effort: Pulmonary effort is normal.  ?   Breath sounds: Normal breath sounds.  ?Abdominal:  ?   General: Abdomen is flat. There is distension.  ?   Palpations: Abdomen is soft.  ?Musculoskeletal:     ?   General: Normal range of motion.  ?   Cervical back: Normal range of motion.  ?Skin: ?   General: Skin is warm and dry.  ?Neurological:  ?   Mental Status: She is alert.  ?Psychiatric:     ?   Mood and Affect: Mood normal.     ?   Behavior: Behavior normal.     ?   Thought Content: Thought content normal.     ?  Judgment: Judgment normal.  ? ?Assessment & Plan:  ?Mary Grimes was seen today for new patient (initial visit). ? ?Diagnoses and all orders for this visit: ? ?Encounter to establish care ?Establish care with new PCP ? ?Vaginal discharge ?-     Cervicovaginal ancillary only ? ?Health education ?Discussed female hygiene how to try avoid infection  ? ?Microcytic anemia ?-     CBC with Differential ? ?Hypokalemia ?-     CMP14+EGFR ? ?  ?Outpatient Encounter Medications as of 01/11/2022  ?Medication Sig  ? [DISCONTINUED] Doxylamine-Pyridoxine (DICLEGIS PO) Take by mouth.  ? [DISCONTINUED] fluticasone (FLONASE) 50 MCG/ACT nasal spray Place 1 spray into both nostrils daily.  ? [DISCONTINUED] guaiFENesin-codeine 100-10 MG/5ML syrup Take 5 mLs by mouth every 8 (eight) hours as needed for cough.  ? [DISCONTINUED] metroNIDAZOLE (METROGEL VAGINAL) 0.75 % vaginal gel Place 1 Applicatorful vaginally 2 (two) times daily.  ? [DISCONTINUED] ondansetron (ZOFRAN  ODT) 4 MG disintegrating tablet Take 1 tablet (4 mg total) by mouth every 8 (eight) hours as needed for nausea or vomiting.  ? [DISCONTINUED] promethazine (PHENERGAN) 25 MG suppository Place 1 suppository (25 mg total) rectally every 6 (six) hours as needed for nausea or vomiting.  ? ?No facility-administered encounter medications on file as of 01/11/2022.  ? ? ?Follow-up: Return if symptoms worsen or fail to improve.  ? ?Kerin Perna, NP ? ?

## 2022-01-11 NOTE — Patient Instructions (Signed)
Contact Dermatitis Dermatitis is redness, soreness, and swelling (inflammation) of the skin. Contact dermatitis is a reaction to something that touches the skin. There are two types of contact dermatitis: Irritant contact dermatitis. This happens when something bothers (irritates) your skin, like soap. Allergic contact dermatitis. This is caused when you are exposed to something that you are allergic to, such as poison ivy. What are the causes? Common causes of irritant contact dermatitis include: Makeup. Soaps. Detergents. Bleaches. Acids. Metals, such as nickel. Common causes of allergic contact dermatitis include: Plants. Chemicals. Jewelry. Latex. Medicines. Preservatives in products, such as clothing. What increases the risk? Having a job that exposes you to things that bother your skin. Having asthma or eczema. What are the signs or symptoms? Symptoms may happen anywhere the irritant has touched your skin. Symptoms include: Dry or flaky skin. Redness. Cracks. Itching. Pain or a burning feeling. Blisters. Blood or clear fluid draining from skin cracks. With allergic contact dermatitis, swelling may occur. This may happen in places such as the eyelids, mouth, or genitals. How is this treated? This condition is treated by checking for the cause of the reaction and protecting your skin. Treatment may also include: Steroid creams, ointments, or medicines. Antibiotic medicines or other ointments, if you have a skin infection. Lotion or medicines to help with itching. A bandage (dressing). Follow these instructions at home: Skin care Moisturize your skin as needed. Put cool cloths on your skin. Put a baking soda paste on your skin. Stir water into baking soda until it looks like a paste. Do not scratch your skin. Avoid having things rub up against your skin. Avoid the use of soaps, perfumes, and dyes. Medicines Take or apply over-the-counter and prescription medicines  only as told by your doctor. If you were prescribed an antibiotic medicine, take or apply it as told by your doctor. Do not stop using it even if your condition starts to get better. Bathing Take a bath with: Epsom salts. Baking soda. Colloidal oatmeal. Bathe less often. Bathe in warm water. Avoid using hot water. Bandage care If you were given a bandage, change it as told by your health care provider. Wash your hands with soap and water before and after you change your bandage. If soap and water are not available, use hand sanitizer. General instructions Avoid the things that caused your reaction. If you do not know what caused it, keep a journal. Write down: What you eat. What skin products you use. What you drink. What you wear in the area that has symptoms. This includes jewelry. Check the affected areas every day for signs of infection. Check for: More redness, swelling, or pain. More fluid or blood. Warmth. Pus or a bad smell. Keep all follow-up visits as told by your doctor. This is important. Contact a doctor if: You do not get better with treatment. Your condition gets worse. You have signs of infection, such as: More swelling. Tenderness. More redness. Soreness. Warmth. You have a fever. You have new symptoms. Get help right away if: You have a very bad headache. You have neck pain. Your neck is stiff. You throw up (vomit). You feel very sleepy. You see red streaks coming from the area. Your bone or joint near the area hurts after the skin has healed. The area turns darker. You have trouble breathing. Summary Dermatitis is redness, soreness, and swelling of the skin. Symptoms may occur where the irritant has touched you. Treatment may include medicines and skin care. If you   do not know what caused your reaction, keep a journal. Contact a doctor if your condition gets worse or you have signs of infection. This information is not intended to replace advice  given to you by your health care provider. Make sure you discuss any questions you have with your health care provider. Document Revised: 02/12/2019 Document Reviewed: 05/08/2018 Elsevier Patient Education  2022 [employer]   

## 2022-01-12 LAB — CERVICOVAGINAL ANCILLARY ONLY
Bacterial Vaginitis (gardnerella): POSITIVE — AB
Candida Glabrata: NEGATIVE
Candida Vaginitis: POSITIVE — AB
Chlamydia: NEGATIVE
Comment: NEGATIVE
Comment: NEGATIVE
Comment: NEGATIVE
Comment: NEGATIVE
Comment: NEGATIVE
Comment: NORMAL
Neisseria Gonorrhea: NEGATIVE
Trichomonas: NEGATIVE

## 2022-01-13 ENCOUNTER — Ambulatory Visit (INDEPENDENT_AMBULATORY_CARE_PROVIDER_SITE_OTHER): Payer: Self-pay | Admitting: *Deleted

## 2022-01-13 ENCOUNTER — Telehealth (INDEPENDENT_AMBULATORY_CARE_PROVIDER_SITE_OTHER): Payer: Self-pay

## 2022-01-13 ENCOUNTER — Other Ambulatory Visit (INDEPENDENT_AMBULATORY_CARE_PROVIDER_SITE_OTHER): Payer: Self-pay | Admitting: Primary Care

## 2022-01-13 LAB — CMP14+EGFR
ALT: 13 IU/L (ref 0–32)
AST: 17 IU/L (ref 0–40)
Albumin/Globulin Ratio: 1.5 (ref 1.2–2.2)
Albumin: 4.4 g/dL (ref 3.9–5.0)
Alkaline Phosphatase: 122 IU/L — ABNORMAL HIGH (ref 44–121)
BUN/Creatinine Ratio: 10 (ref 9–23)
BUN: 9 mg/dL (ref 6–20)
Bilirubin Total: 0.3 mg/dL (ref 0.0–1.2)
CO2: 21 mmol/L (ref 20–29)
Calcium: 9.5 mg/dL (ref 8.7–10.2)
Chloride: 103 mmol/L (ref 96–106)
Creatinine, Ser: 0.87 mg/dL (ref 0.57–1.00)
Globulin, Total: 3 g/dL (ref 1.5–4.5)
Glucose: 80 mg/dL (ref 70–99)
Potassium: 4.8 mmol/L (ref 3.5–5.2)
Sodium: 140 mmol/L (ref 134–144)
Total Protein: 7.4 g/dL (ref 6.0–8.5)
eGFR: 95 mL/min/{1.73_m2} (ref >59)

## 2022-01-13 LAB — CBC WITH DIFFERENTIAL/PLATELET
Basophils Absolute: 0.1 10*3/uL (ref 0.0–0.2)
Basos: 1 %
EOS (ABSOLUTE): 0.3 10*3/uL (ref 0.0–0.4)
Eos: 4 %
Hematocrit: 35.6 % (ref 34.0–46.6)
Hemoglobin: 11.6 g/dL (ref 11.1–15.9)
Immature Grans (Abs): 0 10*3/uL (ref 0.0–0.1)
Immature Granulocytes: 0 %
Lymphocytes Absolute: 3.4 10*3/uL — ABNORMAL HIGH (ref 0.7–3.1)
Lymphs: 38 %
MCH: 26.8 pg (ref 26.6–33.0)
MCHC: 32.6 g/dL (ref 31.5–35.7)
MCV: 82 fL (ref 79–97)
Monocytes Absolute: 0.4 10*3/uL (ref 0.1–0.9)
Monocytes: 5 %
Neutrophils Absolute: 4.5 10*3/uL (ref 1.4–7.0)
Neutrophils: 52 %
Platelets: 481 10*3/uL — ABNORMAL HIGH (ref 150–450)
RBC: 4.33 x10E6/uL (ref 3.77–5.28)
RDW: 14.2 % (ref 11.7–15.4)
WBC: 8.7 10*3/uL (ref 3.4–10.8)

## 2022-01-13 MED ORDER — METRONIDAZOLE 500 MG PO TABS: 500.0000 mg | ORAL_TABLET | Freq: Two times a day (BID) | ORAL | 0 refills | Status: AC

## 2022-01-13 MED ORDER — FLUCONAZOLE 150 MG PO TABS: 150.0000 mg | ORAL_TABLET | Freq: Every day | ORAL | 1 refills | Status: AC

## 2022-01-13 NOTE — Telephone Encounter (Signed)
-----   Message from Grayce Sessions, NP sent at 01/13/2022 11:06 AM EST ----- ?Labs are  normal. Try to drink at least 48 oz of water per day. Work on eating a low fat, heart healthy diet and participate in regular aerobic exercise program to control as well. Exercise at least  30 minutes per day-5 days per week. Monitor eating red meat, fried foods,  junk foods, sodas, sugary foods or drinks, unhealthy snacking, alcohol or smoking.    ? Ancillary is positive for bacterial vaginosis and yeast. You have Bacterial Vaginosis and a yeast infection . Sent in a prescription for metronidazole to treat the bacteria infection.  You take this medication twice a day for 7 days. Be sure that you do not drink alcohol when you take this medication because the combination can give you severe nausea and vomiting. Once you complete this medication take 1 tablet of Fluconazole to treat the yeast infection.   ?

## 2022-01-13 NOTE — Telephone Encounter (Signed)
Noted  

## 2022-01-13 NOTE — Telephone Encounter (Signed)
Pt given lab results per notes of M. Randa Evens, NP  on 01/13/22. Pt verbalized understanding and reports pharmacy will be delivering prescribed medications today.  ?

## 2022-01-13 NOTE — Telephone Encounter (Signed)
Called patient she did not answer. Per DPR left voicemail with results. Labs normal. Vaginal swab positive for BV and yeast. Advised of medications being sent and left instructions on how to take medications properly. Advised patient to return call to clinic at [phone] with any questions or concerns. Maryjean Morn, CMA  ?

## 2022-04-24 ENCOUNTER — Ambulatory Visit: Payer: Self-pay | Admitting: *Deleted

## 2022-04-24 NOTE — Telephone Encounter (Signed)
  Chief Complaint: Vaginal Bleeding Symptoms: Bright red blood with small clots in between periods, first month.Mild,only with wiping. Changes pad infrequently. Frequency: Period late in May, started end of month instead of 1st. Pregnancy test negative.  Pertinent Negatives: Patient denies pain Disposition: [] ED /[] Urgent Care (no appt availability in office) / [] Appointment(In office/virtual)/ []  [facility]/ [] Home Care/ [] Refused Recommended Disposition /[]  Mobile [facility]/ [x]  Follow-up with PCP Additional Notes: Advised pt to alert PCP, call back if symptoms worsen. [facility] info given "I'll go there if I can't see my PCP." Reason for Disposition  [1] Bleeding or spotting between regular periods AND [2] occurs three cycles (3 months) or less this past year  Answer Assessment - Initial Assessment Questions 1. AMOUNT: "Describe the bleeding that you are having."    - SPOTTING: spotting, or pinkish / brownish mucous discharge; does not fill panty liner or pad    - MILD:  less than 1 pad / hour; less than patient's usual menstrual bleeding   - MODERATE: 1-2 pads / hour; 1 menstrual cup every 6 hours; small-medium blood clots (e.g., pea, grape, small coin)   - SEVERE: soaking 2 or more pads/hour for 2 or more hours; 1 menstrual cup every 2 hours; bleeding not contained by pads or continuous red blood from vagina; large blood clots (e.g., golf ball, large coin)      Small clots with wiping. Mild 2. ONSET: "When did the bleeding begin?" "Is it continuing now?"     This month 3. MENSTRUAL PERIOD: "When was the last normal menstrual period?" "How is this different than your period?"     April 4. REGULARITY: "How regular are your periods?"     Been regular up to now 5. ABDOMINAL PAIN: "Do you have any pain?" "How bad is the pain?"  (e.g., Scale 1-10; mild, moderate, or severe)   - MILD (1-3): doesn't interfere with normal activities, abdomen soft and not tender to touch     - MODERATE (4-7): interferes with normal activities or awakens from sleep, abdomen tender to touch    - SEVERE (8-10): excruciating pain, doubled over, unable to do any normal activities      No. Bloated 6. PREGNANCY: "Could you be pregnant?" "Are you sexually active?" "Did you recently give birth?"     Text negative  9. BLOOD THINNERS: "Do you take any blood thinners?" (e.g., Coumadin/warfarin, Pradaxa/dabigatran, aspirin)     No 10. CAUSE: "What do you think is causing the bleeding?" (e.g., recent gyn surgery, recent gyn procedure; known bleeding disorder, cervical cancer, polycystic ovarian disease, fibroids)         No 11. HEMODYNAMIC STATUS: "Are you weak or feeling lightheaded?" If Yes, ask: "Can you stand and walk normally?"        no 12. OTHER SYMPTOMS: "What other symptoms are you having with the bleeding?" (e.g., passed tissue, vaginal discharge, fever, menstrual-type cramps)       no  Protocols used: Vaginal Bleeding - Abnormal-A-AH
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
